# Patient Record
Sex: Female | Born: 1975 | Hispanic: No
Health system: Southern US, Community
[De-identification: ages and names within clinical notes are randomized; demographics above are authoritative.]

---

## 1999-11-11 ENCOUNTER — Other Ambulatory Visit: Admission: RE | Admit: 1999-11-11 | Discharge: 1999-11-11 | Payer: Self-pay | Admitting: Obstetrics and Gynecology

## 2004-09-27 DIAGNOSIS — H359 Unspecified retinal disorder: Secondary | ICD-10-CM | POA: Insufficient documentation

## 2007-09-28 DIAGNOSIS — N63 Unspecified lump in unspecified breast: Secondary | ICD-10-CM | POA: Insufficient documentation

## 2009-01-22 ENCOUNTER — Encounter: Admission: RE | Admit: 2009-01-22 | Discharge: 2009-01-22 | Payer: Self-pay | Admitting: Obstetrics and Gynecology

## 2010-06-10 ENCOUNTER — Emergency Department: Payer: Self-pay | Admitting: Unknown Physician Specialty

## 2014-06-26 ENCOUNTER — Other Ambulatory Visit: Payer: Self-pay | Admitting: Obstetrics and Gynecology

## 2014-06-26 DIAGNOSIS — N63 Unspecified lump in unspecified breast: Secondary | ICD-10-CM

## 2014-07-01 ENCOUNTER — Ambulatory Visit
Admission: RE | Admit: 2014-07-01 | Discharge: 2014-07-01 | Disposition: A | Discharge status: Home / Self Care | Payer: 59 | Source: Ambulatory Visit | Attending: Obstetrics and Gynecology | Admitting: Obstetrics and Gynecology

## 2014-07-01 DIAGNOSIS — N63 Unspecified lump in unspecified breast: Secondary | ICD-10-CM

## 2016-01-26 ENCOUNTER — Other Ambulatory Visit: Payer: Self-pay | Admitting: Obstetrics and Gynecology

## 2016-01-26 DIAGNOSIS — R928 Other abnormal and inconclusive findings on diagnostic imaging of breast: Secondary | ICD-10-CM

## 2016-01-29 ENCOUNTER — Ambulatory Visit
Admission: RE | Admit: 2016-01-29 | Discharge: 2016-01-29 | Disposition: A | Discharge status: Home / Self Care | Payer: 59 | Source: Ambulatory Visit | Attending: Obstetrics and Gynecology | Admitting: Obstetrics and Gynecology

## 2016-01-29 ENCOUNTER — Other Ambulatory Visit: Payer: Self-pay | Admitting: Obstetrics and Gynecology

## 2016-01-29 DIAGNOSIS — R928 Other abnormal and inconclusive findings on diagnostic imaging of breast: Secondary | ICD-10-CM

## 2016-01-29 DIAGNOSIS — N6320 Unspecified lump in the left breast, unspecified quadrant: Secondary | ICD-10-CM

## 2016-02-03 ENCOUNTER — Other Ambulatory Visit: Payer: Self-pay | Admitting: Obstetrics and Gynecology

## 2016-02-03 DIAGNOSIS — N6320 Unspecified lump in the left breast, unspecified quadrant: Secondary | ICD-10-CM

## 2016-02-04 ENCOUNTER — Ambulatory Visit
Admission: RE | Admit: 2016-02-04 | Discharge: 2016-02-04 | Disposition: A | Discharge status: Home / Self Care | Payer: 59 | Source: Ambulatory Visit | Attending: Obstetrics and Gynecology | Admitting: Obstetrics and Gynecology

## 2016-02-04 ENCOUNTER — Other Ambulatory Visit: Payer: Self-pay | Admitting: Obstetrics and Gynecology

## 2016-02-04 DIAGNOSIS — N6320 Unspecified lump in the left breast, unspecified quadrant: Secondary | ICD-10-CM

## 2017-02-25 DIAGNOSIS — N920 Excessive and frequent menstruation with regular cycle: Secondary | ICD-10-CM | POA: Insufficient documentation

## 2019-08-21 ENCOUNTER — Other Ambulatory Visit: Payer: Self-pay | Admitting: Obstetrics and Gynecology

## 2019-08-21 DIAGNOSIS — N6320 Unspecified lump in the left breast, unspecified quadrant: Secondary | ICD-10-CM

## 2019-08-22 ENCOUNTER — Other Ambulatory Visit: Payer: Self-pay

## 2019-08-22 ENCOUNTER — Ambulatory Visit
Admission: RE | Admit: 2019-08-22 | Discharge: 2019-08-22 | Disposition: A | Discharge status: Home / Self Care | Payer: 59 | Source: Ambulatory Visit | Attending: Obstetrics and Gynecology | Admitting: Obstetrics and Gynecology

## 2019-08-22 DIAGNOSIS — N6320 Unspecified lump in the left breast, unspecified quadrant: Secondary | ICD-10-CM

## 2020-01-25 DIAGNOSIS — R32 Unspecified urinary incontinence: Secondary | ICD-10-CM | POA: Insufficient documentation

## 2020-01-25 DIAGNOSIS — N879 Dysplasia of cervix uteri, unspecified: Secondary | ICD-10-CM | POA: Insufficient documentation

## 2020-01-25 DIAGNOSIS — Z9149 Other personal history of psychological trauma, not elsewhere classified: Secondary | ICD-10-CM | POA: Insufficient documentation

## 2020-01-28 ENCOUNTER — Ambulatory Visit: Payer: Managed Care, Other (non HMO) | Admitting: Podiatry

## 2020-01-28 ENCOUNTER — Other Ambulatory Visit: Payer: Self-pay

## 2020-01-28 VITALS — Temp 97.2°F

## 2020-01-28 DIAGNOSIS — L853 Xerosis cutis: Secondary | ICD-10-CM | POA: Diagnosis not present

## 2020-01-28 DIAGNOSIS — B351 Tinea unguium: Secondary | ICD-10-CM | POA: Diagnosis not present

## 2020-01-28 MED ORDER — CICLOPIROX 8 % EX SOLN: Freq: Every day | CUTANEOUS | 0 refills | Status: AC

## 2020-01-28 MED ORDER — CLOTRIMAZOLE-BETAMETHASONE 1-0.05 % EX CREA: 1.0000 "application " | TOPICAL_CREAM | Freq: Two times a day (BID) | CUTANEOUS | 0 refills | Status: AC

## 2020-01-29 ENCOUNTER — Encounter: Payer: Self-pay | Admitting: Podiatry

## 2020-01-29 NOTE — Progress Notes (Signed)
  Subjective:  Patient ID: Sandra Klein, female    DOB: 10/29/1975,  MRN: [id number]  Chief Complaint  Patient presents with  . Nail Problem    nail fungus bilat hallux    44 y.o. female presents with the above complaint.  Patient presents with complaint of bilateral hallux nail fungus.  Patient states that this has been going on for quite some time.  Patient states this has been present for about couple of months and has progressive gotten worse.  There is some cracking on the nails there is some discoloration some tenderness associated with it.  Patient also has secondary complaint of dry heels.  She has been doing over-the-counter lotion therapy but has not helped.  There is mild itching however primarily there is no itching associated with it.  She denies any other acute complaints.  She would like to know what the treatment options were.   Review of Systems: Negative except as noted in the HPI. Denies N/V/F/Ch.  No past medical history on file.  Current Outpatient Medications:  .  ciclopirox (PENLAC) 8 % solution, Apply topically at bedtime. Apply over nail and surrounding skin. Apply daily over previous coat. After seven (7) days, may remove with alcohol and continue cycle., Disp: 6.6 mL, Rfl: 0 .  clotrimazole-betamethasone (LOTRISONE) cream, Apply 1 application topically 2 (two) times daily., Disp: 30 g, Rfl: 0  Social History   Tobacco Use  Smoking Status Not on file    No Known Allergies Objective:   Vitals:   01/28/20 1621  Temp: (!) 97.2 F (36.2 C)   There is no height or weight on file to calculate BMI. Constitutional Well developed. Well nourished.  Vascular Dorsalis pedis pulses palpable bilaterally. Posterior tibial pulses palpable bilaterally. Capillary refill normal to all digits.  No cyanosis or clubbing noted. Pedal hair growth normal.  Neurologic Normal speech. Oriented to person, place, and time. Epicritic sensation to light touch grossly present  bilaterally.  Dermatologic Nails dystrophic, elongated, mildly thickened bilateral hallux noted.  Rest of the toenails were within normal limits Skin xerotic, dry skin noted to the bilateral heels.  No underlying itching noted.  Orthopedic: Normal joint ROM without pain or crepitus bilaterally. No visible deformities. No bony tenderness.   Radiographs: None Assessment:   1. Xerosis of skin   2. Nail fungus    Plan:  Patient was evaluated and treated and all questions answered.  Bilateral hallux onychomycosis -Educated the patient on the etiology of onychomycosis and various treatment options associated with improving the fungal load.  I explained to the patient that there is 3 treatment options available to treat the onychomycosis including topical, p.o., laser treatment.  Patient has elected to undergo topical medication with Penlac.  I explained to the patient that this only works 10 to 20% of the time however patient does not want any invasive or would like to forego laser treatment for now.  If worsening of the onychomycosis she will consider different treatment options.  Bilateral heel xerosis -I explained to the patient the etiology of xerosis and various treatment options were discussed.  Patient applies over-the-counter lotions daily however this has not helped.  At this time patient may benefit from a specialized cream.  It is difficult to distinguish if there is of fungal component associated with this.  I believe patient will benefit of Lotrisone cream to apply twice daily.  Patient states understanding  No follow-ups on file.

## 2020-09-30 ENCOUNTER — Other Ambulatory Visit: Payer: Self-pay | Admitting: Obstetrics and Gynecology

## 2020-09-30 DIAGNOSIS — R1031 Right lower quadrant pain: Secondary | ICD-10-CM

## 2020-11-07 ENCOUNTER — Other Ambulatory Visit: Payer: Managed Care, Other (non HMO)

## 2021-08-24 ENCOUNTER — Other Ambulatory Visit: Payer: Self-pay | Admitting: Obstetrics and Gynecology

## 2021-08-24 DIAGNOSIS — R928 Other abnormal and inconclusive findings on diagnostic imaging of breast: Secondary | ICD-10-CM

## 2021-09-14 ENCOUNTER — Ambulatory Visit
Admission: RE | Admit: 2021-09-14 | Discharge: 2021-09-14 | Disposition: A | Discharge status: Home / Self Care | Payer: Managed Care, Other (non HMO) | Source: Ambulatory Visit | Attending: Obstetrics and Gynecology | Admitting: Obstetrics and Gynecology

## 2021-09-14 ENCOUNTER — Other Ambulatory Visit: Payer: Self-pay | Admitting: Obstetrics and Gynecology

## 2021-09-14 ENCOUNTER — Ambulatory Visit
Admission: RE | Admit: 2021-09-14 | Discharge: 2021-09-14 | Disposition: A | Discharge status: Home / Self Care | Payer: BC Managed Care – PPO | Source: Ambulatory Visit | Attending: Obstetrics and Gynecology | Admitting: Obstetrics and Gynecology

## 2021-09-14 DIAGNOSIS — R928 Other abnormal and inconclusive findings on diagnostic imaging of breast: Secondary | ICD-10-CM

## 2021-09-29 ENCOUNTER — Ambulatory Visit
Admission: RE | Admit: 2021-09-29 | Discharge: 2021-09-29 | Disposition: A | Discharge status: Home / Self Care | Payer: BC Managed Care – PPO | Source: Ambulatory Visit | Attending: Obstetrics and Gynecology | Admitting: Obstetrics and Gynecology

## 2021-09-29 DIAGNOSIS — R928 Other abnormal and inconclusive findings on diagnostic imaging of breast: Secondary | ICD-10-CM

## 2021-10-05 ENCOUNTER — Other Ambulatory Visit: Payer: BC Managed Care – PPO

## 2022-06-08 IMAGING — MG MM BREAST LOCALIZATION CLIP
4 series · 4 of 12 positions shown · non-contrast
Comparison: Previous exam(s).

CLINICAL DATA: Status post left breast ultrasound-guided biopsy.

EXAM:
3D DIAGNOSTIC LEFT MAMMOGRAM POST ULTRASOUND BIOPSY

[L ML synth-2D]
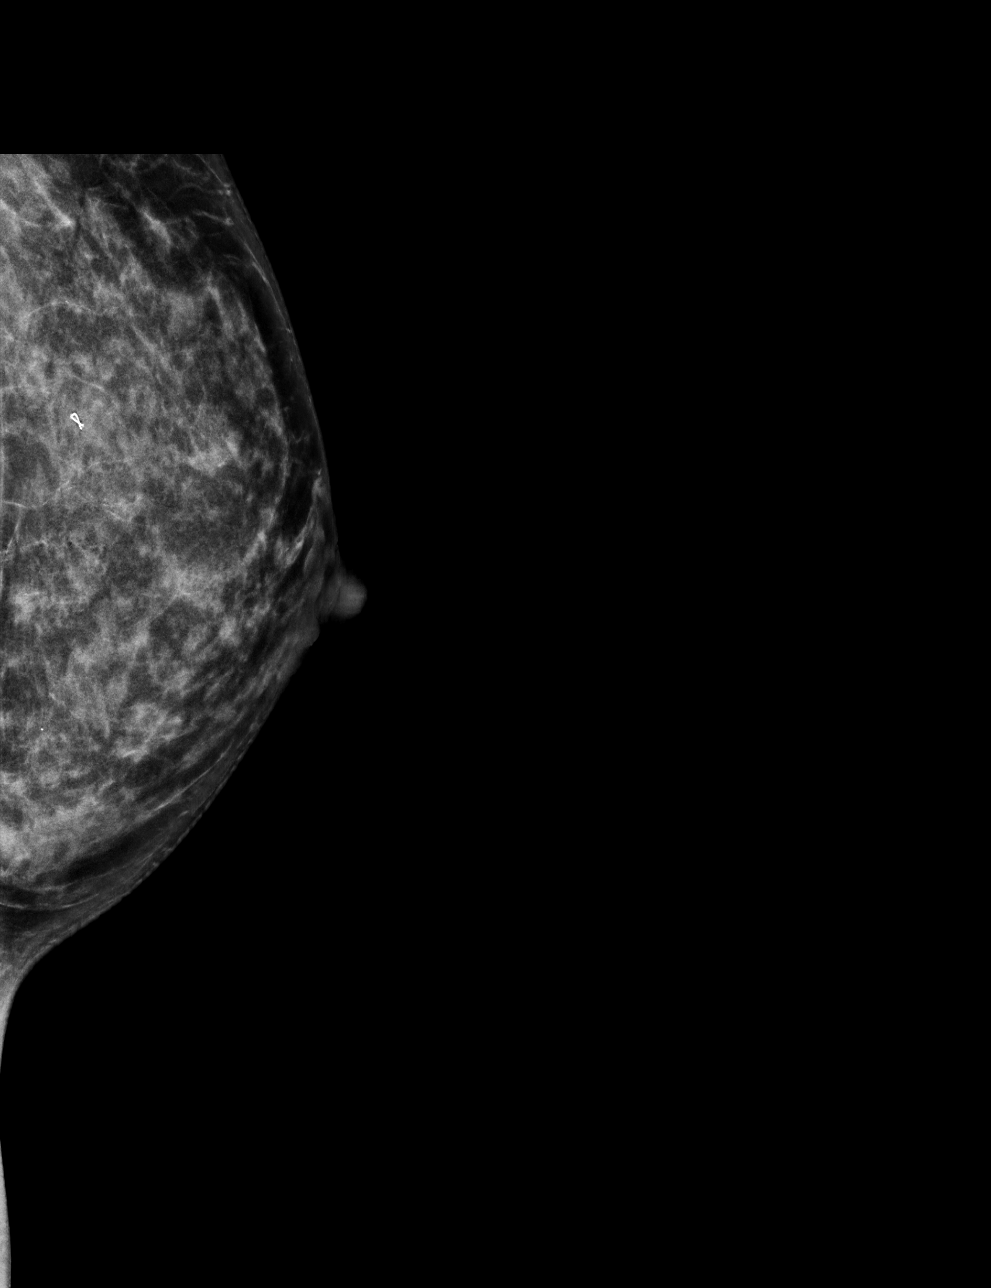

[L CC synth-2D]
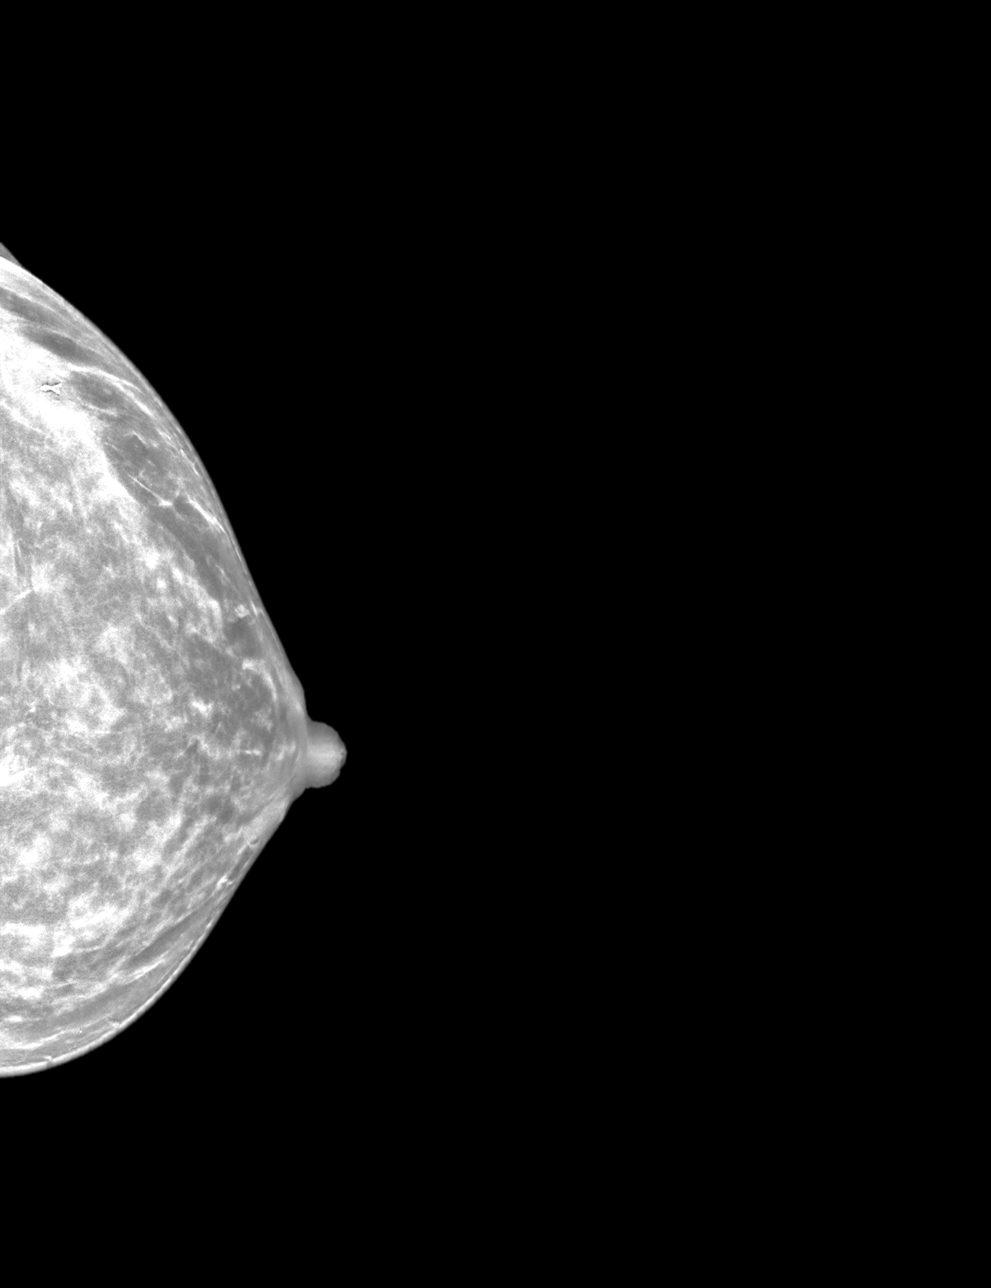

[L CC tomo · tomo slice 30/59.0]
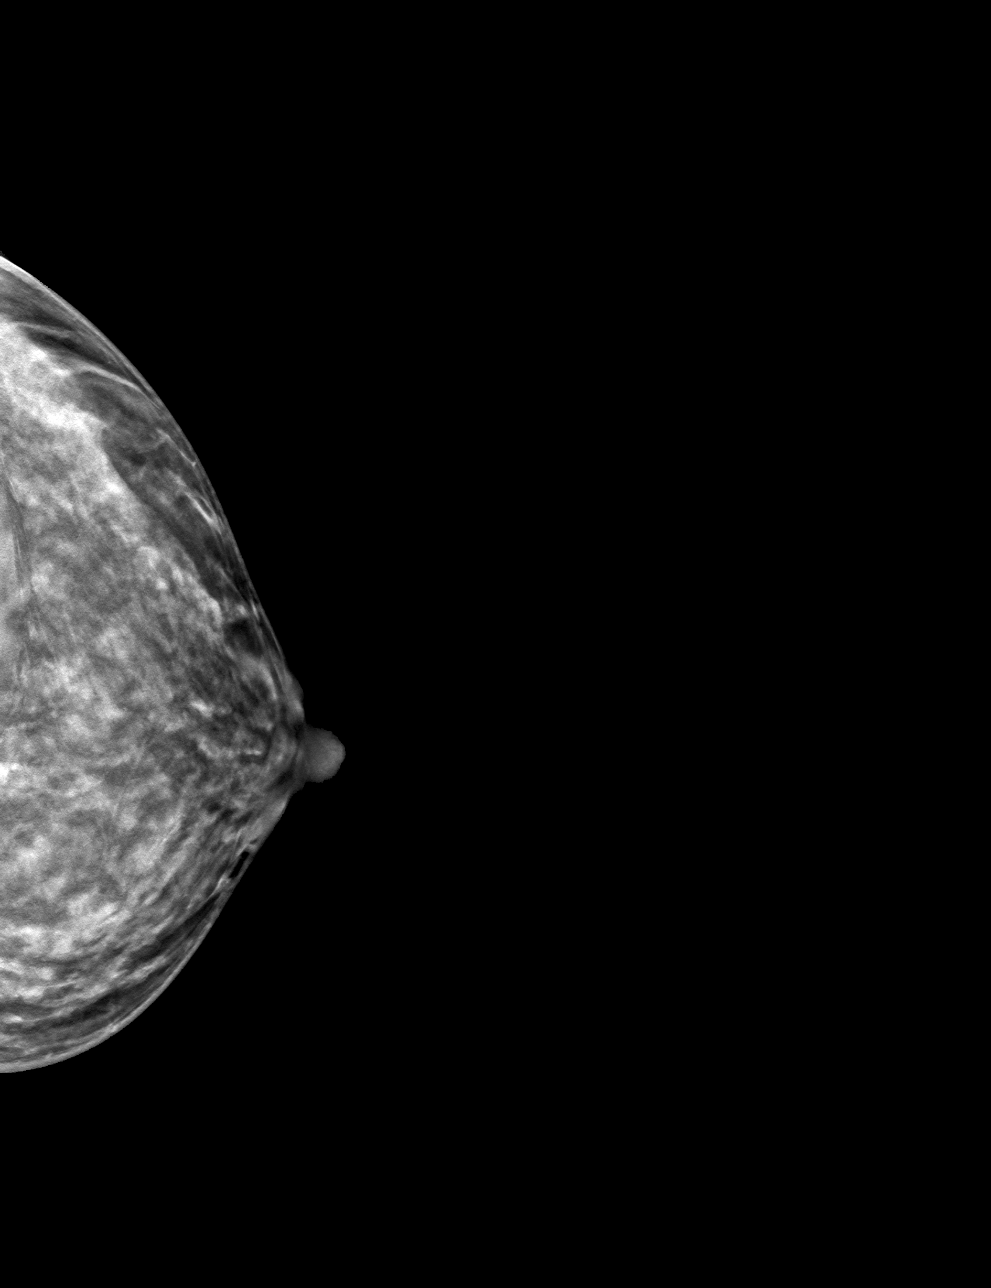

[L ML tomo · tomo slice 29/56.0]
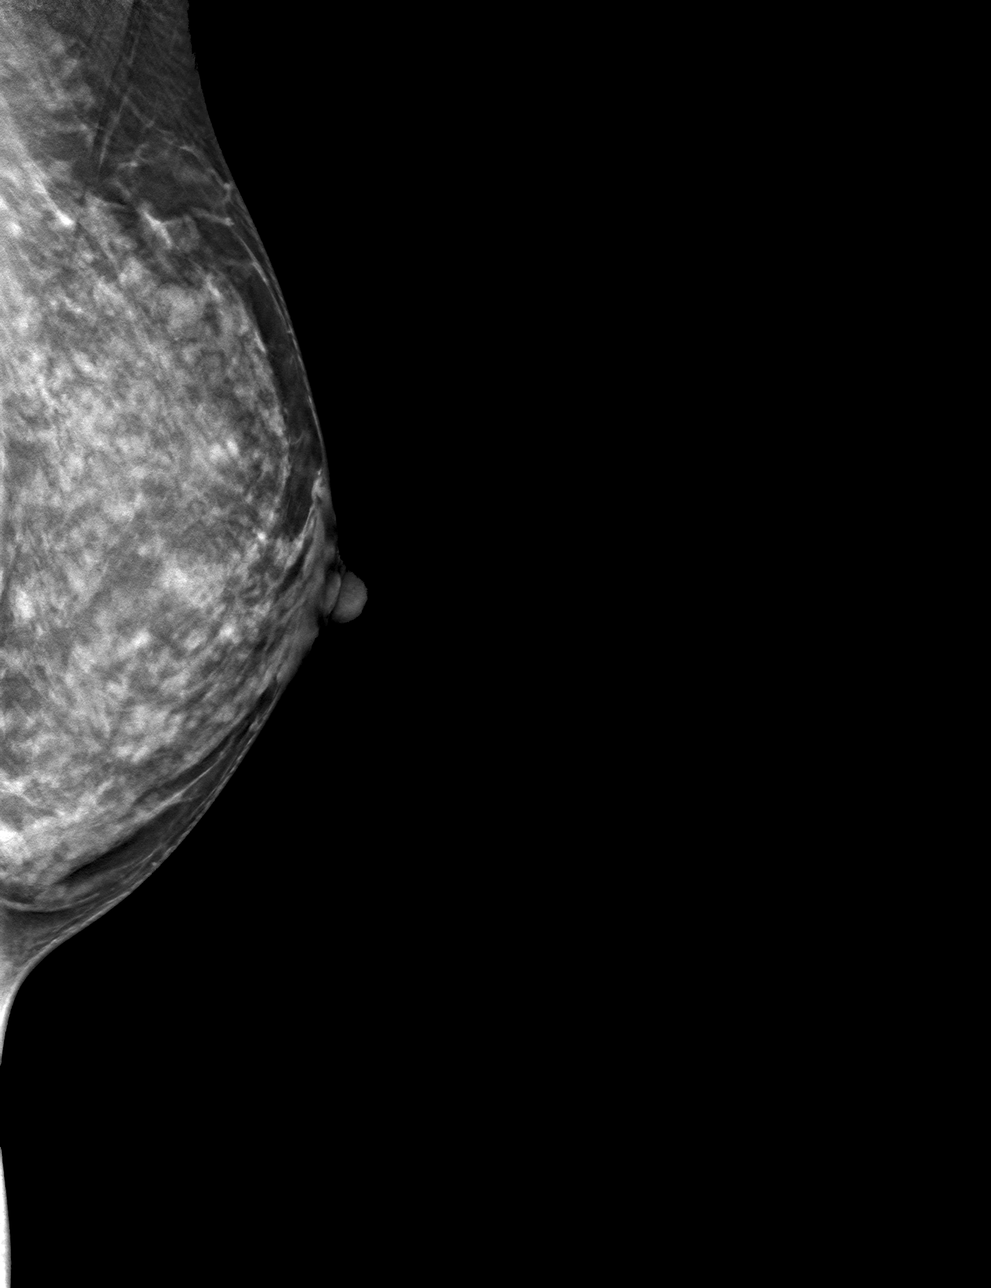

[4 of 12 positions shown; findings below may reference images not displayed]

FINDINGS: 3D Mammographic images were obtained following ultrasound guided
biopsy of the left breast. The biopsy marking clip is in expected
position at the site of biopsy.
IMPRESSION: Appropriate positioning of the ribbon shaped biopsy marking clip at
the site of biopsy in the upper-outer left breast.

Final Assessment: Post Procedure Mammograms for Marker Placement

## 2022-06-08 IMAGING — US US BREAST BX W LOC DEV 1ST LESION IMG BX SPEC US GUIDE*L*
1 series · 7 of 7 positions shown · non-contrast
Comparison: Previous exam(s).
COMPARISON: Previous exam(s).

Addendum:
CLINICAL DATA: 45-year-old female with indeterminate left breast
masses. Please note, at the time of preprocedural evaluation, the
second cystic mass at the 2 o'clock position 4 cm from the nipple
measuring 3 mm has resolved. It could not be identified despite
extensive scanning through the area. Therefore, biopsy of the larger
mass at the 2 o'clock position 3 cm from the nipple was the only
procedure performed today.

EXAM:
ULTRASOUND GUIDED LEFT BREAST CORE NEEDLE BIOPSY

[Series 1: us breast bx w loc dev 1st lesion img bx spec us g · 0.04mm/px · 7 of 7 slices shown]
[im 1/7]
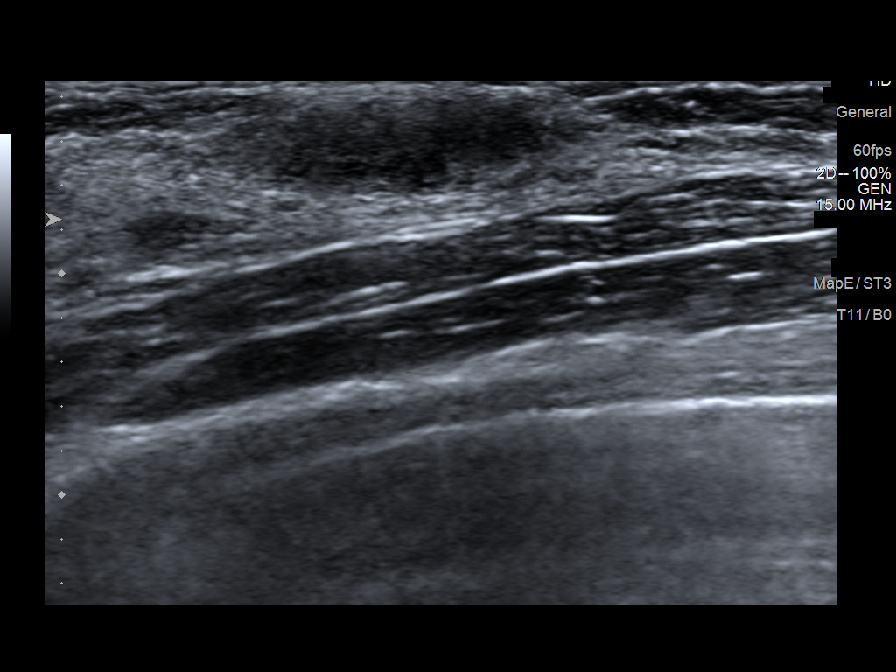
[im 2/7]
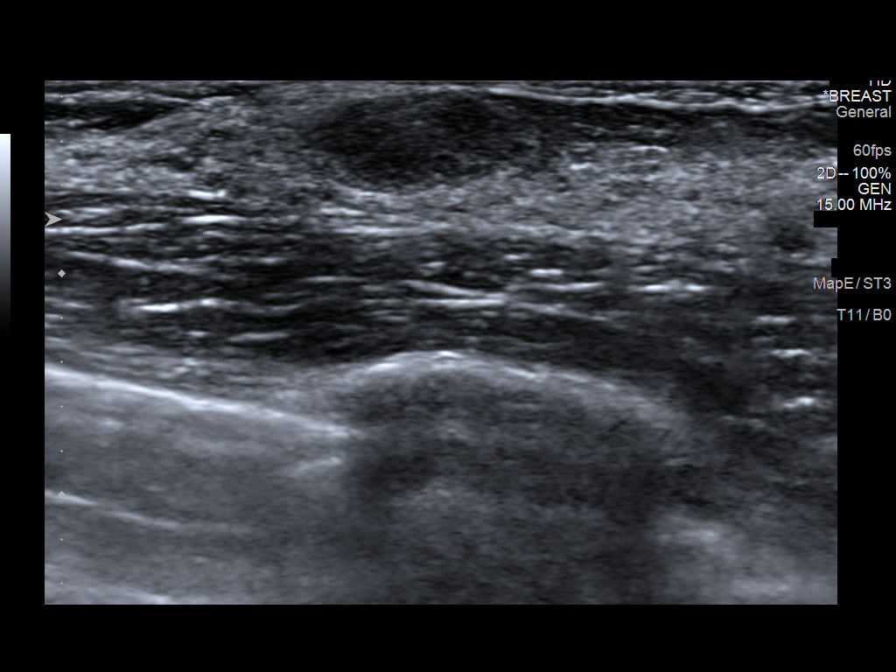
[im 3/7]
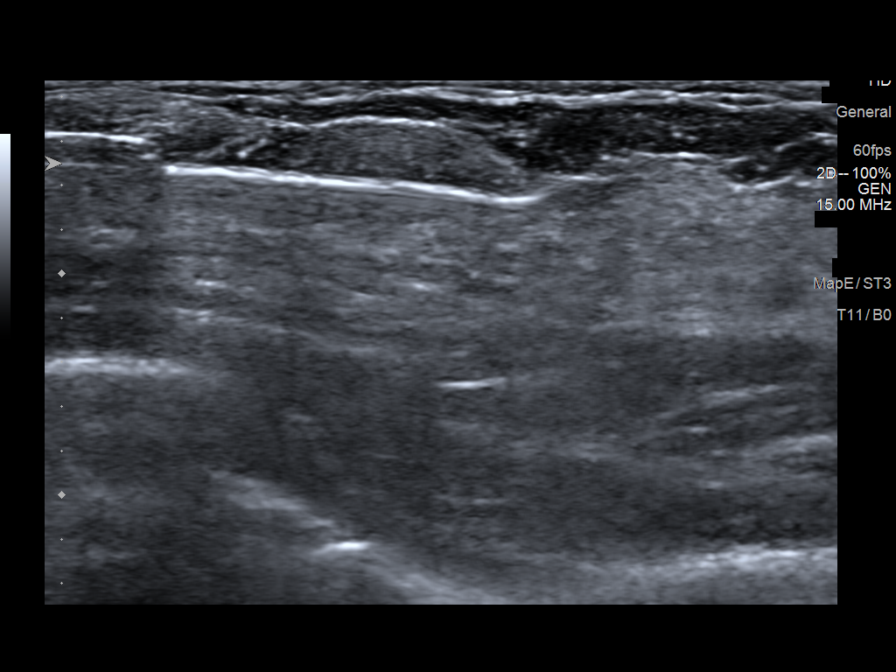
[im 4/7]
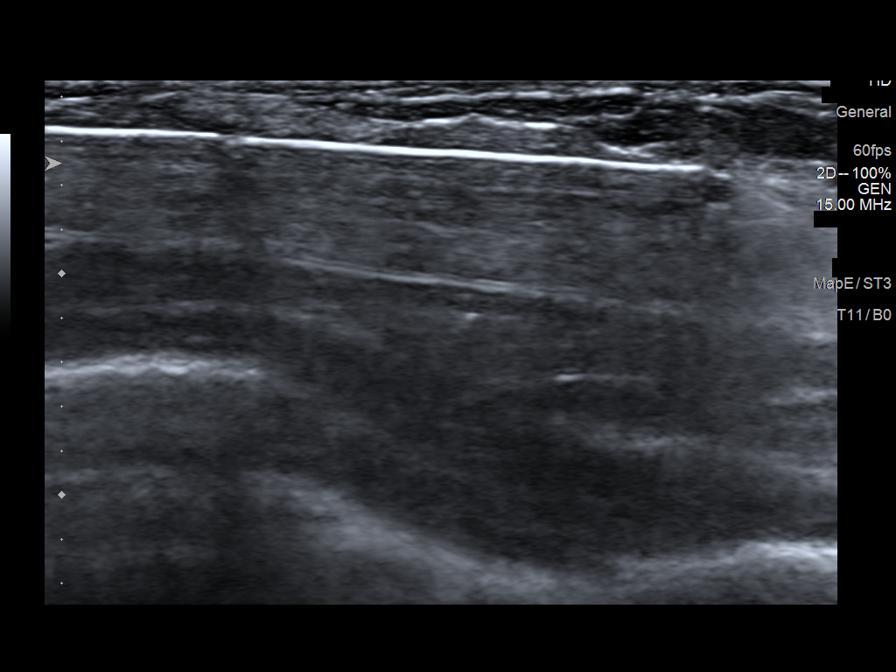
[im 5/7]
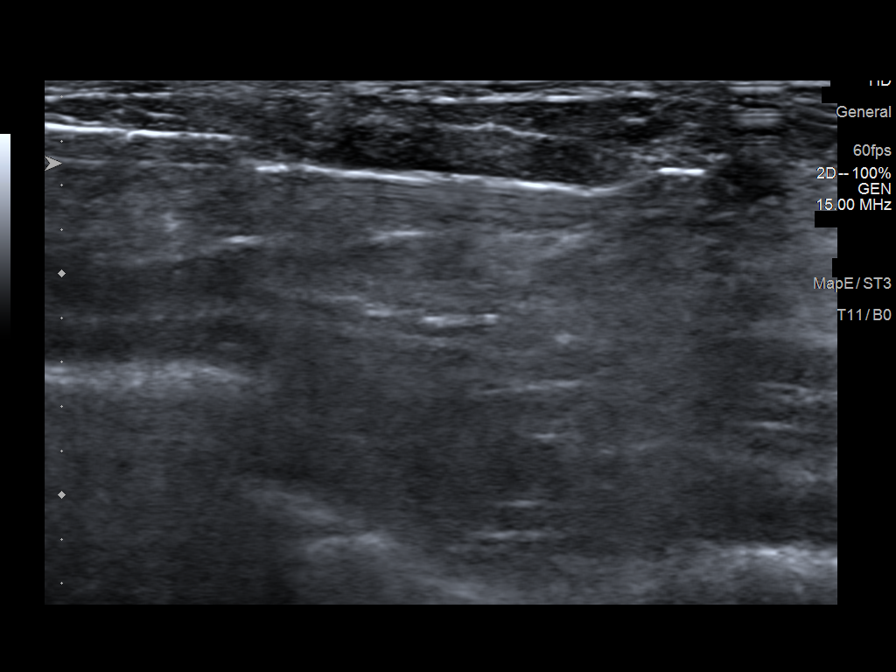
[im 6/7]
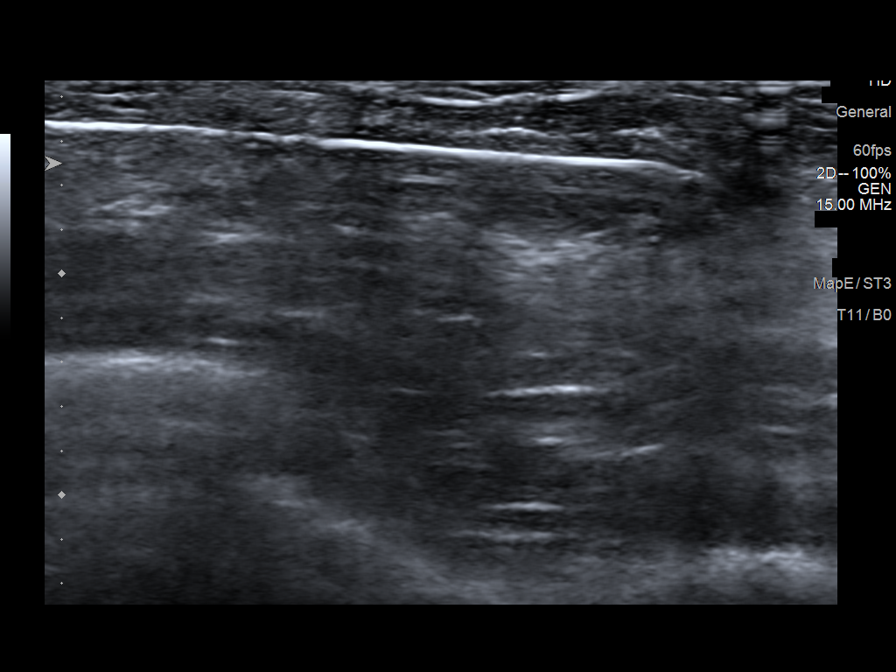
[im 7/7]
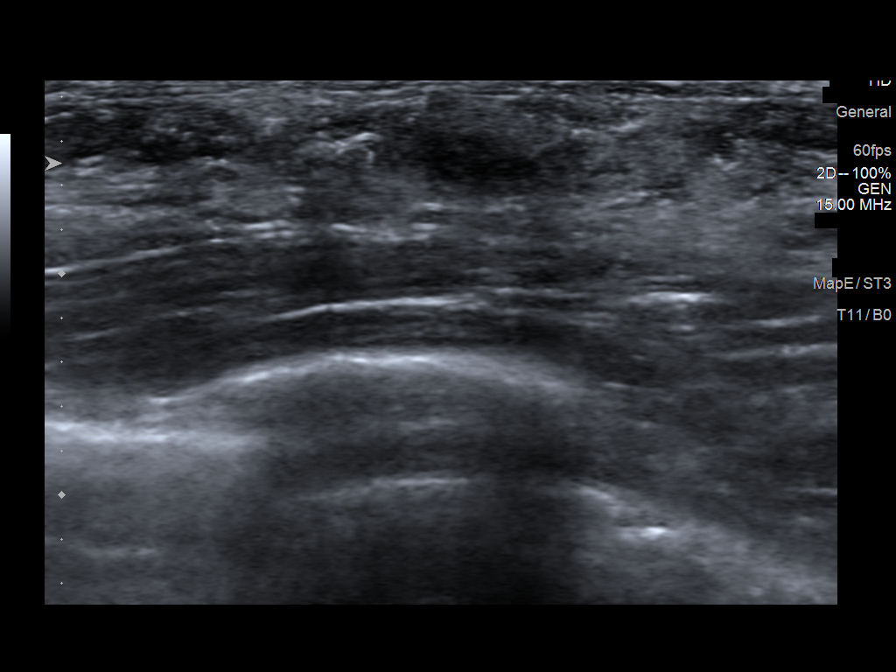

[7 of 7 positions shown; findings below may reference images not displayed]



Lesion quadrant: Upper outer quadrant

Using sterile technique and 1% Lidocaine as local anesthetic, under
direct ultrasound visualization, a 14 gauge Van Hallen device was
used to perform biopsy of a mass at the 2 o'clock position 3 cm from
the nipple using a inferior approach. At the conclusion of the
procedure a ribbon shaped tissue marker clip was deployed into the
biopsy cavity. Follow up 2 view mammogram was performed and dictated
separately.
IMPRESSION: Ultrasound guided biopsy of the left breast. No apparent
complications.

Additional cystic mass at the 2 o'clock position 4 cm from the
nipple has resolved in the interim.

ADDENDUM:
Pathology revealed FIBROADENOMA of the LEFT breast larger mass, 2
o'clock, 7cmfn (ribbon clip). This was found to be concordant by Dr.
Maryalice Elise.

Pathology results were discussed with the patient by telephone. The
patient reported doing well after the biopsy with tenderness at the
site. Post biopsy instructions and care were reviewed and questions
were answered. The patient was encouraged to call The [REDACTED]

The patient was instructed to return for annual screening
mammography and informed a reminder notice would be sent regarding
this appointment.

Pathology results reported by Nawawi Beta RN on 09/30/2021.



Lesion quadrant: Upper outer quadrant

Using sterile technique and 1% Lidocaine as local anesthetic, under
direct ultrasound visualization, a 14 gauge Van Hallen device was
used to perform biopsy of a mass at the 2 o'clock position 3 cm from
the nipple using a inferior approach. At the conclusion of the
procedure a ribbon shaped tissue marker clip was deployed into the
biopsy cavity. Follow up 2 view mammogram was performed and dictated
separately.
IMPRESSION: Ultrasound guided biopsy of the left breast. No apparent
complications.

Additional cystic mass at the 2 o'clock position 4 cm from the
nipple has resolved in the interim.

## 2022-09-08 ENCOUNTER — Other Ambulatory Visit: Payer: Self-pay | Admitting: Obstetrics and Gynecology

## 2022-09-08 DIAGNOSIS — R928 Other abnormal and inconclusive findings on diagnostic imaging of breast: Secondary | ICD-10-CM

## 2022-09-24 ENCOUNTER — Ambulatory Visit: Payer: BC Managed Care – PPO

## 2022-09-24 ENCOUNTER — Ambulatory Visit
Admission: RE | Admit: 2022-09-24 | Discharge: 2022-09-24 | Disposition: A | Discharge status: Home / Self Care | Payer: Managed Care, Other (non HMO) | Source: Ambulatory Visit | Attending: Obstetrics and Gynecology | Admitting: Obstetrics and Gynecology

## 2022-09-24 DIAGNOSIS — R928 Other abnormal and inconclusive findings on diagnostic imaging of breast: Secondary | ICD-10-CM

## 2023-05-06 ENCOUNTER — Other Ambulatory Visit: Payer: Self-pay | Admitting: Obstetrics and Gynecology

## 2023-05-06 DIAGNOSIS — N6310 Unspecified lump in the right breast, unspecified quadrant: Secondary | ICD-10-CM

## 2023-05-19 ENCOUNTER — Ambulatory Visit
Admission: RE | Admit: 2023-05-19 | Discharge: 2023-05-19 | Disposition: A | Discharge status: Home / Self Care | Payer: Managed Care, Other (non HMO) | Source: Ambulatory Visit | Attending: Obstetrics and Gynecology | Admitting: Obstetrics and Gynecology

## 2023-05-19 DIAGNOSIS — N6310 Unspecified lump in the right breast, unspecified quadrant: Secondary | ICD-10-CM
